# Patient Record
Sex: Male | Born: 2000 | Race: White | Hispanic: No | Marital: Single | Smoking: Never smoker
Health system: Southern US, Community
[De-identification: ages and names within clinical notes are randomized; demographics above are authoritative.]

## PROBLEM LIST (undated history)

## (undated) DIAGNOSIS — J45909 Unspecified asthma, uncomplicated: Secondary | ICD-10-CM

---
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
## 2022-03-14 ENCOUNTER — Emergency Department: Payer: Self-pay

## 2022-03-14 ENCOUNTER — Other Ambulatory Visit: Payer: Self-pay

## 2022-03-14 ENCOUNTER — Emergency Department
Admission: EM | Admit: 2022-03-14 | Discharge: 2022-03-14 | Disposition: A | Discharge status: Home / Self Care | Payer: Self-pay | Attending: Emergency Medicine | Admitting: Emergency Medicine

## 2022-03-14 ENCOUNTER — Encounter: Payer: Self-pay | Admitting: Emergency Medicine

## 2022-03-14 DIAGNOSIS — J4521 Mild intermittent asthma with (acute) exacerbation: Secondary | ICD-10-CM

## 2022-03-14 DIAGNOSIS — Z20822 Contact with and (suspected) exposure to covid-19: Secondary | ICD-10-CM | POA: Insufficient documentation

## 2022-03-14 DIAGNOSIS — J45901 Unspecified asthma with (acute) exacerbation: Secondary | ICD-10-CM | POA: Insufficient documentation

## 2022-03-14 DIAGNOSIS — D72829 Elevated white blood cell count, unspecified: Secondary | ICD-10-CM | POA: Insufficient documentation

## 2022-03-14 DIAGNOSIS — J40 Bronchitis, not specified as acute or chronic: Secondary | ICD-10-CM

## 2022-03-14 LAB — BASIC METABOLIC PANEL
Anion gap: 9 (ref 5–15)
BUN: 18 mg/dL (ref 6–20)
CO2: 25 mmol/L (ref 22–32)
Calcium: 9.2 mg/dL (ref 8.9–10.3)
Chloride: 104 mmol/L (ref 98–111)
Creatinine, Ser: 0.9 mg/dL (ref 0.61–1.24)
GFR, Estimated: >60 mL/min (ref >60)
Glucose, Bld: 101 mg/dL — ABNORMAL HIGH (ref 70–99)
Potassium: 3.7 mmol/L (ref 3.5–5.1)
Sodium: 138 mmol/L (ref 135–145)

## 2022-03-14 LAB — CBC
HCT: 46.8 % (ref 39.0–52.0)
Hemoglobin: 15.7 g/dL (ref 13.0–17.0)
MCH: 28.9 pg (ref 26.0–34.0)
MCHC: 33.5 g/dL (ref 30.0–36.0)
MCV: 86 fL (ref 80.0–100.0)
Platelets: 243 10*3/uL (ref 150–400)
RBC: 5.44 MIL/uL (ref 4.22–5.81)
RDW: 12.3 % (ref 11.5–15.5)
WBC: 13.2 10*3/uL — ABNORMAL HIGH (ref 4.0–10.5)
nRBC: 0 % (ref 0.0–0.2)

## 2022-03-14 LAB — RESP PANEL BY RT-PCR (FLU A&B, COVID) ARPGX2
Influenza A by PCR: NEGATIVE
Influenza B by PCR: NEGATIVE
SARS Coronavirus 2 by RT PCR: NEGATIVE

## 2022-03-14 LAB — TROPONIN I (HIGH SENSITIVITY): Troponin I (High Sensitivity): 6 ng/L (ref <18)

## 2022-03-14 MED ORDER — ALBUTEROL SULFATE HFA 108 (90 BASE) MCG/ACT IN AERS: 2.0000 | INHALATION_SPRAY | Freq: Four times a day (QID) | RESPIRATORY_TRACT | 2 refills | Status: AC | PRN

## 2022-03-14 MED ORDER — BENZONATATE 100 MG PO CAPS: 100.0000 mg | ORAL_CAPSULE | Freq: Three times a day (TID) | ORAL | 0 refills | Status: AC | PRN

## 2022-03-14 MED ORDER — IPRATROPIUM-ALBUTEROL 0.5-2.5 (3) MG/3ML IN SOLN
3.0000 mL | Freq: Once | RESPIRATORY_TRACT | Status: AC
  Administered 2022-03-14: 3 mL via RESPIRATORY_TRACT
  Filled 2022-03-14: qty 3

## 2022-03-14 MED ORDER — PREDNISONE 20 MG PO TABS
60.0000 mg | ORAL_TABLET | Freq: Once | ORAL | Status: AC
  Administered 2022-03-14: 60 mg via ORAL
  Filled 2022-03-14: qty 3

## 2022-03-14 MED ORDER — IPRATROPIUM-ALBUTEROL 0.5-2.5 (3) MG/3ML IN SOLN: 3.0000 mL | Freq: Once | RESPIRATORY_TRACT | Status: AC

## 2022-03-14 MED ORDER — AZITHROMYCIN 250 MG PO TABS: ORAL_TABLET | ORAL | 0 refills | Status: AC

## 2022-03-14 MED ORDER — PREDNISONE 50 MG PO TABS: 50.0000 mg | ORAL_TABLET | Freq: Every day | ORAL | 0 refills | Status: AC

## 2022-03-14 MED ORDER — IPRATROPIUM-ALBUTEROL 0.5-2.5 (3) MG/3ML IN SOLN
RESPIRATORY_TRACT | Status: AC
  Administered 2022-03-14: 3 mL via RESPIRATORY_TRACT
  Filled 2022-03-14: qty 3

## 2022-03-14 NOTE — Discharge Instructions (Addendum)
-  Please take the full course of the prednisone as prescribed.  Please take the full course of the azithromycin as well.  This will cover for possible infections.  -You may utilize the benzonatate as needed for the coughing.  -You may utilize the albuterol as needed for cough/shortness of breath.  -Follow-up with your primary care provider as needed.  -Return to the emergency department anytime if you begin to experience any new or worsening symptoms.

## 2022-03-14 NOTE — ED Notes (Signed)
See triage note  Presents with chest pain since yesterday. States pain increases with inspiration  States occasional cough which is productive  Afebrile on arrival

## 2022-03-14 NOTE — ED Provider Notes (Signed)
[facility] Provider Note    Event Date/Time   First MD Initiated Contact with Patient 03/14/22 1015     (approximate)   History   Chief Complaint Chest Pain and Shortness of Breath   HPI Christopher Fritz is a 21 y.o. male, history of asthma, presents emergency department for evaluation of central chest pain, shortness of breath, and cough/congestion x1 day.  He states that he has a history of asthma and usually takes albuterol inhaler as needed, however he continues to have symptoms.  Reports being around other people who may have been sick.  Denies fever/chills, abdominal pain, flank pain, nausea/vomiting, diarrhea, dysuria, headache, vision change, hearing change, rash/lesions, or dizziness/lightheadedness.  History Limitations: No limitations.        Physical Exam  Triage Vital Signs: ED Triage Vitals  Enc Vitals Group     BP 03/14/22 0938 104/82     Pulse Rate 03/14/22 0938 (!) 103     Resp 03/14/22 0938 20     Temp 03/14/22 0938 98.3 F (36.8 C)     Temp Source 03/14/22 0938 Oral     SpO2 03/14/22 0938 98 %     Weight 03/14/22 0950 145 lb (65.8 kg)     Height 03/14/22 0950 6\' 2"  (1.88 m)     Head Circumference --      Peak Flow --      Pain Score 03/14/22 0949 6     Pain Loc --      Pain Edu? --      Excl. in GC? --     Most recent vital signs: Vitals:   03/14/22 0938  BP: 104/82  Pulse: (!) 103  Resp: 20  Temp: 98.3 F (36.8 C)  SpO2: 98%    General: Awake, NAD.  Skin: Warm, dry. No rashes or lesions.  Eyes: PERRL. Conjunctivae normal.  CV: Good peripheral perfusion.  Resp: Normal effort.  Expiratory wheezing noted in the bases bilaterally. Abd: Soft, non-tender. No distention.  Neuro: At baseline. No gross neurological deficits.  Musculoskeletal: Normal ROM of all extremities.  Focused Exam: Throat exam unremarkable.  No tonsillar exudates or swelling.  Physical Exam    ED Results / Procedures / Treatments  Labs (all  labs ordered are listed, but only abnormal results are displayed) Labs Reviewed  BASIC METABOLIC PANEL - Abnormal; Notable for the following components:      Result Value   Glucose, Bld 101 (*)    All other components within normal limits  CBC - Abnormal; Notable for the following components:   WBC 13.2 (*)    All other components within normal limits  RESP PANEL BY RT-PCR (FLU A&B, COVID) ARPGX2  TROPONIN I (HIGH SENSITIVITY)  TROPONIN I (HIGH SENSITIVITY)     EKG Sinus tachycardia with arrhythmia, no significant ST segment changes, moderately elevated T waves consistent with previous EKGs, no AV blocks, no axis deviations, no QT prolongation.    RADIOLOGY  ED Provider Interpretation: I personally viewed and interpreted this x-ray, no evidence of acute cardiopulmonary disease.  DG Chest 2 View  Result Date: 03/14/2022 CLINICAL DATA:  Chest pain EXAM: CHEST - 2 VIEW COMPARISON:  Chest x-ray dated January 05, 2020 FINDINGS: The heart size and mediastinal contours are within normal limits. Both lungs are clear. The visualized skeletal structures are unremarkable. IMPRESSION: No active cardiopulmonary disease. Electronically Signed   By: January 07, 2020 M.D.   On: 03/14/2022 10:13    PROCEDURES:  Critical  Care performed: N/A.  Procedures    MEDICATIONS ORDERED IN ED: Medications  ipratropium-albuterol (DUONEB) 0.5-2.5 (3) MG/3ML nebulizer solution 3 mL (3 mLs Nebulization Given 03/14/22 0955)  ipratropium-albuterol (DUONEB) 0.5-2.5 (3) MG/3ML nebulizer solution 3 mL (3 mLs Nebulization Given 03/14/22 1039)  predniSONE (DELTASONE) tablet 60 mg (60 mg Oral Given 03/14/22 1039)     IMPRESSION / MDM / ASSESSMENT AND PLAN / ED COURSE  I reviewed the triage vital signs and the nursing notes.                              Differential diagnosis includes, but is not limited to, COVID-19, influenza, bronchitis, community-acquired pneumonia, asthma exacerbation  ED Course Patient  appears well, mildly tachycardic at 103, otherwise normal vitals.  Afebrile.  Has mild leukocytosis at 13.2.  No anemia.  BMP shows no significant electrolyte abnormalities or AKI.  Respiratory panel negative for COVID-19 or influenza.  On reexamination, his wheezing has significantly improved.  Assessment/Plan Presentation consistent with viral bronchitis, likely exacerbating underlying asthma.  Treated here with DuoNeb solutions and prednisone.  He appeared to respond well to treatment.  He has a mild leukocytosis at 13.2, otherwise normal labs.  Respiratory panel negative for COVID-19 or influenza.  He appears well clinically.  We will provide him with a note for prednisone, azithromycin, benzonatate, and albuterol.  Recommend they follow-up with his primary care provider as needed.  Will discharge.  Considered admission for this patient, but given his stable presentation and good response to treatment, he is unlikely benefit from admission.  Provided the patient with anticipatory guidance, return precautions, and educational material. Encouraged the patient to return to the emergency department at any time if they begin to experience any new or worsening symptoms. Patient expressed understanding and agreed with the plan.   Patient's presentation is most consistent with acute complicated illness / injury requiring diagnostic workup.       FINAL CLINICAL IMPRESSION(S) / ED DIAGNOSES   Final diagnoses:  Bronchitis  Mild intermittent asthma with exacerbation     Rx / DC Orders   ED Discharge Orders          Ordered    predniSONE (DELTASONE) 50 MG tablet  Daily with breakfast        03/14/22 1224    benzonatate (TESSALON PERLES) 100 MG capsule  3 times daily PRN        03/14/22 1224    albuterol (VENTOLIN HFA) 108 (90 Base) MCG/ACT inhaler  Every 6 hours PRN        03/14/22 1224    azithromycin (ZITHROMAX) 250 MG tablet  Daily        03/14/22 1224             Note:   This document was prepared using Dragon voice recognition software and may include unintentional dictation errors.   Teodoro Spray, PA 03/14/22 1225    Blake Divine, MD 03/14/22 [phone]

## 2022-03-14 NOTE — ED Triage Notes (Signed)
Pt via POV from home. Pt c/o generalized chest pain, SOB, expiratory wheezing, and cough/congestion since yesterday. Pt has a hx of asthma but has expiratory wheezing noted by this RN. Pt states he hasn't had to use a neb/inhaler for a while. Pt is A&OX4 and NAD
